# Patient Record
Sex: Male | Born: 1975 | Hispanic: Yes | Marital: Married | Smoking: Current every day smoker
Health system: Southern US, Community
[De-identification: ages and names within clinical notes are randomized; demographics above are authoritative.]

## PROBLEM LIST (undated history)

## (undated) DIAGNOSIS — I10 Essential (primary) hypertension: Secondary | ICD-10-CM

## (undated) DIAGNOSIS — T7840XA Allergy, unspecified, initial encounter: Secondary | ICD-10-CM

## (undated) HISTORY — DX: Allergy, unspecified, initial encounter: T78.40XA

---

## 2002-06-21 ENCOUNTER — Emergency Department (HOSPITAL_COMMUNITY): Admission: EM | Admit: 2002-06-21 | Discharge: 2002-06-21 | Payer: Self-pay | Admitting: Emergency Medicine

## 2007-02-28 ENCOUNTER — Emergency Department (HOSPITAL_COMMUNITY): Admission: EM | Admit: 2007-02-28 | Discharge: 2007-02-28 | Payer: Self-pay | Admitting: Emergency Medicine

## 2012-08-08 ENCOUNTER — Observation Stay (HOSPITAL_COMMUNITY): Payer: Private Health Insurance - Indemnity

## 2012-08-08 ENCOUNTER — Observation Stay (HOSPITAL_COMMUNITY)
Admission: EM | Admit: 2012-08-08 | Discharge: 2012-08-08 | Disposition: A | Discharge status: Home / Self Care | Payer: Private Health Insurance - Indemnity | Attending: Emergency Medicine | Admitting: Emergency Medicine

## 2012-08-08 ENCOUNTER — Encounter (HOSPITAL_COMMUNITY): Payer: Self-pay | Admitting: Emergency Medicine

## 2012-08-08 ENCOUNTER — Emergency Department (HOSPITAL_COMMUNITY): Payer: Private Health Insurance - Indemnity

## 2012-08-08 DIAGNOSIS — F17200 Nicotine dependence, unspecified, uncomplicated: Secondary | ICD-10-CM | POA: Insufficient documentation

## 2012-08-08 DIAGNOSIS — K760 Fatty (change of) liver, not elsewhere classified: Secondary | ICD-10-CM

## 2012-08-08 DIAGNOSIS — I10 Essential (primary) hypertension: Secondary | ICD-10-CM

## 2012-08-08 DIAGNOSIS — M79609 Pain in unspecified limb: Secondary | ICD-10-CM | POA: Insufficient documentation

## 2012-08-08 DIAGNOSIS — M25519 Pain in unspecified shoulder: Secondary | ICD-10-CM | POA: Insufficient documentation

## 2012-08-08 DIAGNOSIS — R079 Chest pain, unspecified: Principal | ICD-10-CM

## 2012-08-08 HISTORY — DX: Essential (primary) hypertension: I10

## 2012-08-08 LAB — CBC WITH DIFFERENTIAL/PLATELET
Basophils Absolute: 0.1 10*3/uL (ref 0.0–0.1)
Eosinophils Absolute: 0.3 10*3/uL (ref 0.0–0.7)
Eosinophils Relative: 5 % (ref 0–5)
Lymphocytes Relative: 44 % (ref 12–46)
MCH: 31.1 pg (ref 26.0–34.0)
MCV: 88.7 fL (ref 78.0–100.0)
Neutrophils Relative %: 42 % — ABNORMAL LOW (ref 43–77)
Platelets: 198 10*3/uL (ref 150–400)
RDW: 12.7 % (ref 11.5–15.5)
WBC: 6.6 10*3/uL (ref 4.0–10.5)

## 2012-08-08 LAB — TROPONIN I
Troponin I: <0.3 ng/mL (ref <0.30)
Troponin I: <0.3 ng/mL (ref <0.30)

## 2012-08-08 LAB — BASIC METABOLIC PANEL
Calcium: 9.5 mg/dL (ref 8.4–10.5)
GFR calc non Af Amer: 88 mL/min — ABNORMAL LOW (ref >90)
Sodium: 140 mEq/L (ref 135–145)

## 2012-08-08 MED ORDER — METOPROLOL TARTRATE 1 MG/ML IV SOLN
INTRAVENOUS | Status: AC
  Filled 2012-08-08: qty 5

## 2012-08-08 MED ORDER — HYDROCHLOROTHIAZIDE 25 MG PO TABS: 25.0000 mg | ORAL_TABLET | Freq: Every day | ORAL | Status: DC

## 2012-08-08 MED ORDER — ASPIRIN 81 MG PO CHEW
324.0000 mg | CHEWABLE_TABLET | Freq: Once | ORAL | Status: AC
  Administered 2012-08-08: 324 mg via ORAL
  Filled 2012-08-08: qty 4

## 2012-08-08 MED ORDER — METOPROLOL TARTRATE 25 MG PO TABS
100.0000 mg | ORAL_TABLET | Freq: Once | ORAL | Status: AC
  Administered 2012-08-08: 100 mg via ORAL
  Filled 2012-08-08: qty 4

## 2012-08-08 MED ORDER — IOHEXOL 350 MG/ML SOLN
80.0000 mL | Freq: Once | INTRAVENOUS | Status: AC | PRN
  Administered 2012-08-08: 80 mL via INTRAVENOUS

## 2012-08-08 NOTE — ED Notes (Signed)
Pt BMI is 30.9. Ht 5 ft 7 in, Wt 197.3 Lbs

## 2012-08-08 NOTE — ED Provider Notes (Signed)
Patient care passed on from Marcus Cole, [place]. 36 y/o male in CDU on CPP. Currently denies any chest pain, sob, shoulder or left arm pain, dizziness, or nausea. CTA results without any significant stenosis. Will discharge home with instructions for close f/u with cardiology. Also d/c with HCTZ as he discussed with Trixie Dredge, and will d/c lisinopril. Fatty liver seen on CT as well. Patient admits to a lot of alcohol use. Advised him to talk about this with his PCP. ETOH and smoking cessation highly encouraged. Case discussed with Dr. Donnetta Hutching who agrees with plan of care.  Trevor Mace, PA-C 08/08/12 1637

## 2012-08-08 NOTE — ED Notes (Addendum)
Called Dr. Carolan Shiver, pt HR 54, BP 128/85. Report pt ready to go to CT. Called CT, table unavailable at this time

## 2012-08-08 NOTE — ED Notes (Signed)
Patient transported to X-ray 

## 2012-08-08 NOTE — ED Notes (Signed)
Per Dr. Llana Aliment, give pt 100 mg PO Metoprolol once.

## 2012-08-08 NOTE — ED Provider Notes (Signed)
Medical screening examination/treatment/procedure(s) were performed by non-physician practitioner and as supervising physician I was immediately available for consultation/collaboration.   Benny Lennert, MD 08/08/12 1530

## 2012-08-08 NOTE — ED Notes (Signed)
Patient is back from x-ray and placed on monitor

## 2012-08-08 NOTE — ED Notes (Signed)
Pt states pain is gone and is just sleepy right now.

## 2012-08-08 NOTE — ED Notes (Signed)
Patient was at work when he started having left sided chest pain that radiated to his shoulders and left arm.  Denies any sob, dizziness, but became slightly nauseated.

## 2012-08-08 NOTE — ED Provider Notes (Signed)
12:46 PM Patient to move to CDU for chest pain protocol.  Sign out received from Marcus Pel, PA-C.  Patient with left sided chest pain that radiates into his left jaw and left arm.  Pt is a smoker, has untreated HTN.  Mother with CAD, recently had stent placed.  Pt to have coronary CT.  I have alerted CDU nurse Sarah who will call to find out if patient might have test done this afternoon.    1:35 PM Patient has arrived to CDU.  Reports pain in left chest now feels like "a bruise" and also "tingling."  Tingling also involves left face and shoulder.  No needs at this time.  Patient is A&Ox4, NAD, RRR, no m/r/g, chest ttp, lungs CTAB, abd soft, NT, extremities without edema, distal pulses intact. Patient verbalizes understanding of plan.  Will continue to follow.    3:19 PM Patient currently in CT.  Pt signed out to Marcus Gourd, PA-C, who assumes care of patient at change of shift.  Pt has been trying to take lisinopril for known hypertension but doesn't like the way it makes him feel.  Consider changing to HCTZ, with close follow up with PCP Dr Merla Riches at [facility] (if CT is negative).    [place], [place] 08/08/12 1520

## 2012-08-08 NOTE — ED Provider Notes (Signed)
History     CSN: [id number]  Arrival date & time 08/08/12  0904   First MD Initiated Contact with Patient 08/08/12 [phone]      Chief Complaint  Patient presents with  . Chest Pain    (Consider location/radiation/quality/duration/timing/severity/associated sxs/prior treatment) HPI  Patient presents to the emergency department with complaints of left-sided chest pain that radiates to the shoulder and the left arm. Pain started at 8 AM this morning while he was at work and has been waxing and waning every 10 minutes. He denies having any shortness of breath, dizziness, diaphoresis, nausea. He is currently pain free. He denies ever having any complaints of chest pain In the past a previously. He has risk factors of smoking, hypertension, family history. He states that his mom just had a stent placed but his dad does not have any heart disease. In the exam room his blood pressure is 132/76 and he remains pain-free. He states that pushing on it does not elicit the pain. He states that resting does not affect the pain. NAD/VSS   Past Medical History  Diagnosis Date  . Hypertension     History reviewed. No pertinent past surgical history.  History reviewed. No pertinent family history.  History  Substance Use Topics  . Smoking status: Current Everyday Smoker  . Smokeless tobacco: Not on file  . Alcohol Use: Yes      Review of Systems   HEENT: denies blurry vision or change in hearing PULMONARY: Denies difficulty breathing and SOB CARDIAC: denies chest pain or heart palpitations MUSCULOSKELETAL:  denies being unable to ambulate ABDOMEN AL: denies abdominal pain GU: denies loss of bowel or urinary control NEURO: denies numbness and tingling in extremities SKIN: no new rashes PSYCH: patient denies anxiety or depression. NECK: Pt denies having neck pain     Allergies  Review of patient's allergies indicates no known allergies.  Home Medications   Current Outpatient Rx    Name Route Sig Dispense Refill  . LISINOPRIL 20 MG PO TABS Oral Take 20 mg by mouth daily.    . ADULT MULTIVITAMIN W/MINERALS CH Oral Take 1 tablet by mouth daily.      BP 132/91  Pulse 73  Temp 98.1 F (36.7 C) (Oral)  Resp 17  SpO2 97%  Physical Exam  Nursing note and vitals reviewed. Constitutional: He appears well-developed and well-nourished. No distress.  HENT:  Head: Normocephalic and atraumatic.  Eyes: Pupils are equal, round, and reactive to light.  Neck: Normal range of motion. Neck supple.  Cardiovascular: Normal rate and regular rhythm.   Pulmonary/Chest: Effort normal. He exhibits no tenderness, no crepitus and no retraction.  Abdominal: Soft.  Neurological: He is alert.  Skin: Skin is warm and dry.    ED Course  Procedures (including critical care time)  Labs Reviewed  CBC WITH DIFFERENTIAL - Abnormal; Notable for the following:    Neutrophils Relative 42 (*)     All other components within normal limits  BASIC METABOLIC PANEL - Abnormal; Notable for the following:    Glucose, Bld 102 (*)     GFR calc non Af Amer 88 (*)     All other components within normal limits  TROPONIN I  TROPONIN I   Dg Chest 2 View  08/08/2012  *RADIOLOGY REPORT*  Clinical Data: Chest pain, history hypertension, smoking  CHEST - 2 VIEW  Comparison: None  Findings: Upper-normal size of cardiac silhouette. Mediastinal contours and pulmonary vascularity normal. Lungs clear. No pleural effusion  or pneumothorax. No acute osseous findings.  IMPRESSION: No acute abnormalities.   Original Report Authenticated By: Lollie Marrow, M.D.      No diagnosis found.    MDM   Date: 08/08/2012  Rate: 78  Rhythm: normal sinus rhythm  QRS Axis: normal  Intervals: normal  ST/T Wave abnormalities: normal  Conduction Disutrbances:none  Narrative Interpretation:   Old EKG Reviewed: none available   Patient work-up is benign thus far. Pt has 3 risks factors, hypertension, smoker and family  history. I have low suspicion that this pain is cardiac but after discussing with Dr. Estell Harpin, the patient is a good candidate for cardiac rule out.   Patient hand off to CDU PA for chest pain protocol and CT angio of the heart ordered.      Dorthula Matas, PA 08/08/12 1248

## 2012-08-08 NOTE — ED Provider Notes (Signed)
Medical screening examination/treatment/procedure(s) were performed by non-physician practitioner and as supervising physician I was immediately available for consultation/collaboration.   Jamaira Sherk L Manoah Deckard, MD 08/08/12 1530 

## 2012-08-08 NOTE — ED Notes (Signed)
Pt tolerated CT well, given 1 nitro SL at 1523.

## 2012-08-08 NOTE — ED Notes (Signed)
Patient also c/o tingling on his left face

## 2012-08-08 NOTE — ED Notes (Signed)
Pt c/o left sided CP with radiation into neck and left arm starting today

## 2012-08-11 NOTE — ED Provider Notes (Signed)
Medical screening examination/treatment/procedure(s) were performed by non-physician practitioner and as supervising physician I was immediately available for consultation/collaboration.  Donnetta Hutching, MD 08/11/12 2119

## 2012-08-17 NOTE — Progress Notes (Signed)
Observation review is complete for the 08/08/2012 visit. 

## 2012-10-25 ENCOUNTER — Encounter: Payer: Self-pay | Admitting: Internal Medicine

## 2012-10-25 ENCOUNTER — Ambulatory Visit (INDEPENDENT_AMBULATORY_CARE_PROVIDER_SITE_OTHER): Payer: Managed Care, Other (non HMO) | Admitting: Internal Medicine

## 2012-10-25 VITALS — BP 133/85 | HR 108 | Temp 98.3°F | Resp 18 | Ht 66.5 in | Wt 196.2 lb

## 2012-10-25 DIAGNOSIS — J40 Bronchitis, not specified as acute or chronic: Secondary | ICD-10-CM

## 2012-10-25 MED ORDER — AZITHROMYCIN 500 MG PO TABS: 500.0000 mg | ORAL_TABLET | Freq: Every day | ORAL | Status: DC

## 2012-10-25 MED ORDER — HYDROCODONE-ACETAMINOPHEN 7.5-325 MG/15ML PO SOLN: 15.0000 mL | Freq: Four times a day (QID) | ORAL | Status: DC | PRN

## 2012-10-25 NOTE — Patient Instructions (Addendum)
Bronchitis  Bronchitis is a problem of the air tubes leading to your lungs. This problem makes it hard for air to get in and out of the lungs. You may cough a lot because your air tubes are narrow. Going without care can cause lasting (chronic) bronchitis.  HOME CARE    Drink enough fluids to keep your pee (urine) clear or pale yellow.   Use a cool mist humidifier.   Quit smoking if you smoke. If you keep smoking, the bronchitis might not get better.   Only take medicine as told by your doctor.  GET HELP RIGHT AWAY IF:    Coughing keeps you awake.   You start to wheeze.   You become more sick or weak.   You have a hard time breathing or get short of breath.   You cough up blood.   Coughing lasts more than 2 weeks.   You have a fever.   Your baby is older than 3 months with a rectal temperature of 102 F (38.9 C) or higher.   Your baby is 3 months old or younger with a rectal temperature of 100.4 F (38 C) or higher.  MAKE SURE YOU:   Understand these instructions.   Will watch your condition.   Will get help right away if you are not doing well or get worse.  Document Released: 05/18/2008 Document Revised: 02/22/2012 Document Reviewed: 11/01/2009  ExitCare Patient Information 2013 [employer].

## 2012-10-25 NOTE — Progress Notes (Signed)
  Subjective:    Patient ID: Marcus Cole, male    DOB: 1976/06/03, 36 y.o.   MRN: [id number]  HPI Cough and yellow sputum for 5-6days Smoker, no sob or cp   Review of Systems     Objective:   Physical Exam Coarse bs, no rales       Assessment & Plan:  Bronchitis

## 2013-06-01 ENCOUNTER — Other Ambulatory Visit: Payer: Self-pay | Admitting: Family Medicine

## 2013-09-07 ENCOUNTER — Ambulatory Visit (INDEPENDENT_AMBULATORY_CARE_PROVIDER_SITE_OTHER): Payer: 59 | Admitting: Family Medicine

## 2013-09-07 ENCOUNTER — Ambulatory Visit: Payer: 59

## 2013-09-07 VITALS — BP 120/88 | HR 82 | Temp 98.0°F | Resp 18 | Ht 66.5 in | Wt 186.0 lb

## 2013-09-07 DIAGNOSIS — R079 Chest pain, unspecified: Secondary | ICD-10-CM

## 2013-09-07 DIAGNOSIS — R0781 Pleurodynia: Secondary | ICD-10-CM

## 2013-09-07 MED ORDER — MELOXICAM 15 MG PO TABS: 15.0000 mg | ORAL_TABLET | Freq: Every day | ORAL | Status: DC

## 2013-09-07 MED ORDER — HYDROCODONE-ACETAMINOPHEN 5-325 MG PO TABS: 1.0000 | ORAL_TABLET | Freq: Three times a day (TID) | ORAL | Status: DC | PRN
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
## 2014-03-26 IMAGING — CR DG RIBS W/ CHEST 3+V*L*
4 series · 4 of 4 positions shown · non-contrast
Comparison: None.

CLINICAL DATA: Left rib and chest pain.

EXAM:
LEFT RIBS AND CHEST - 3+ VIEW

[lateral]
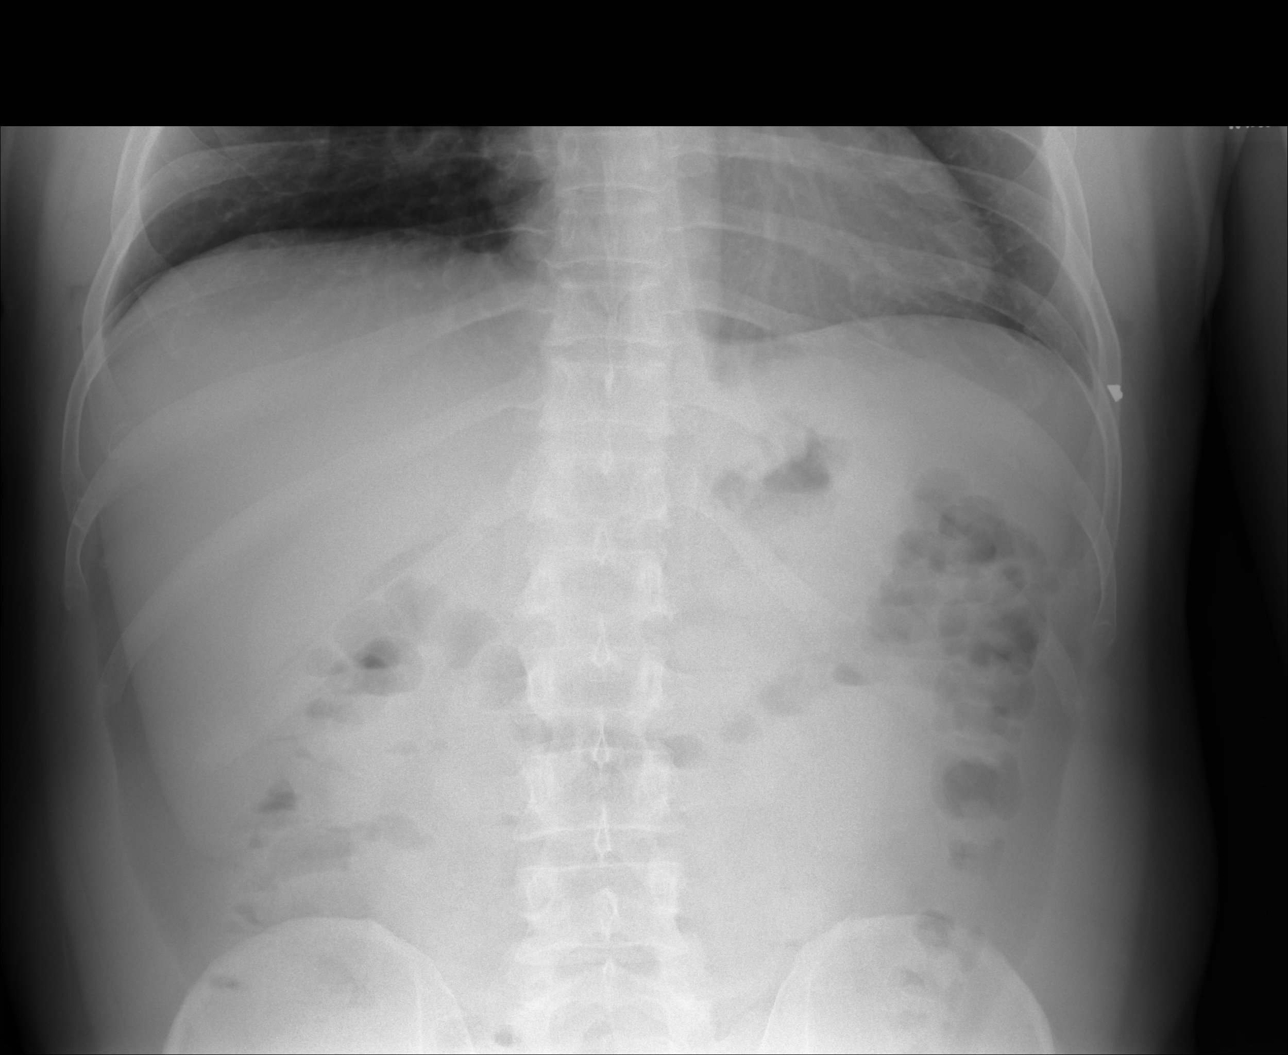

[PA]
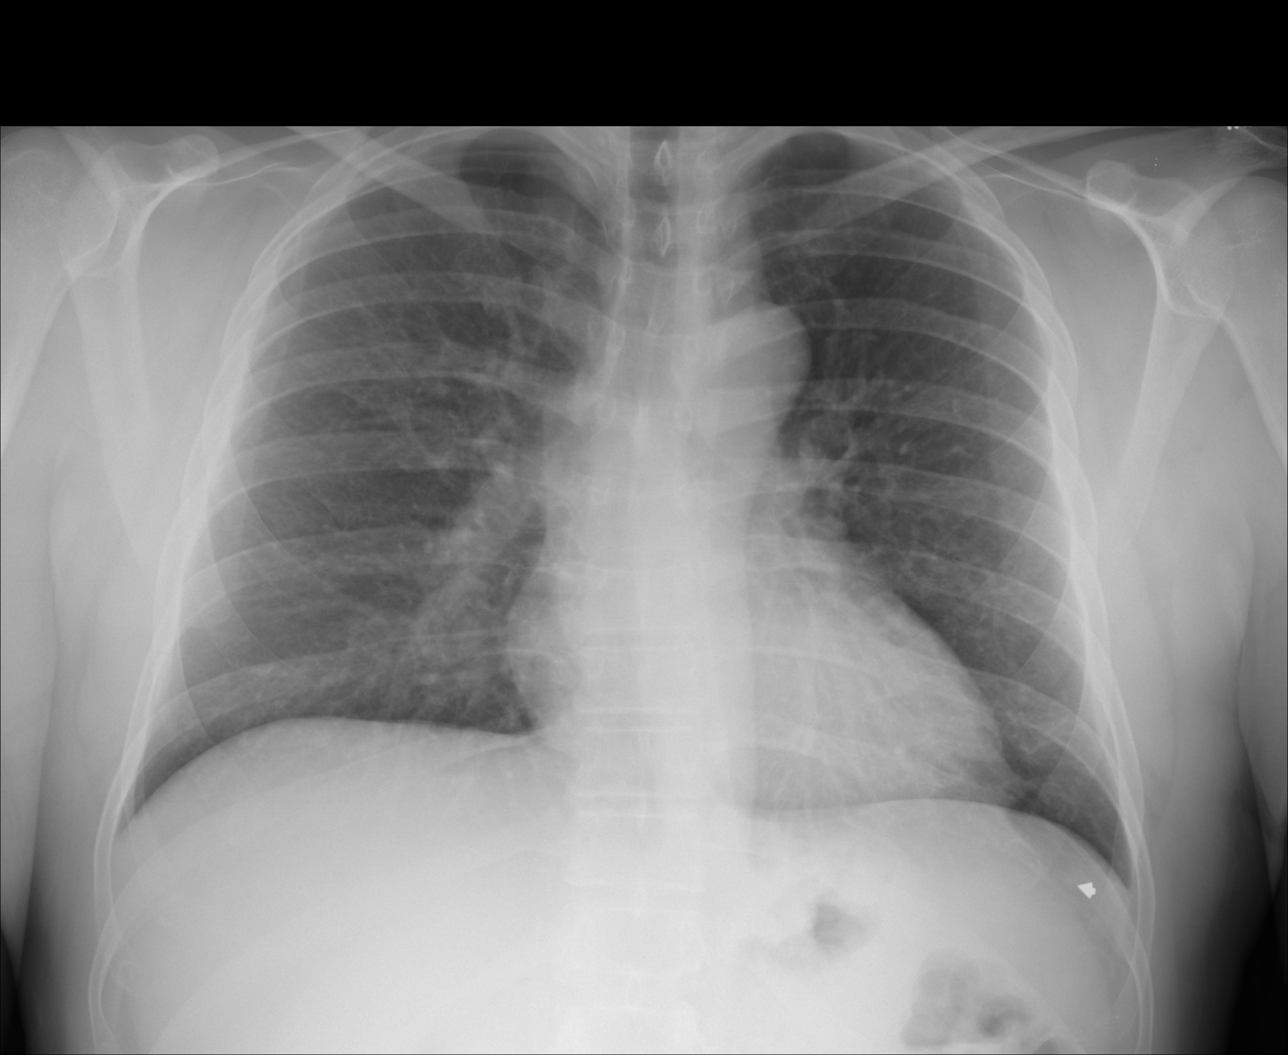

[oblique (1 of 2)]
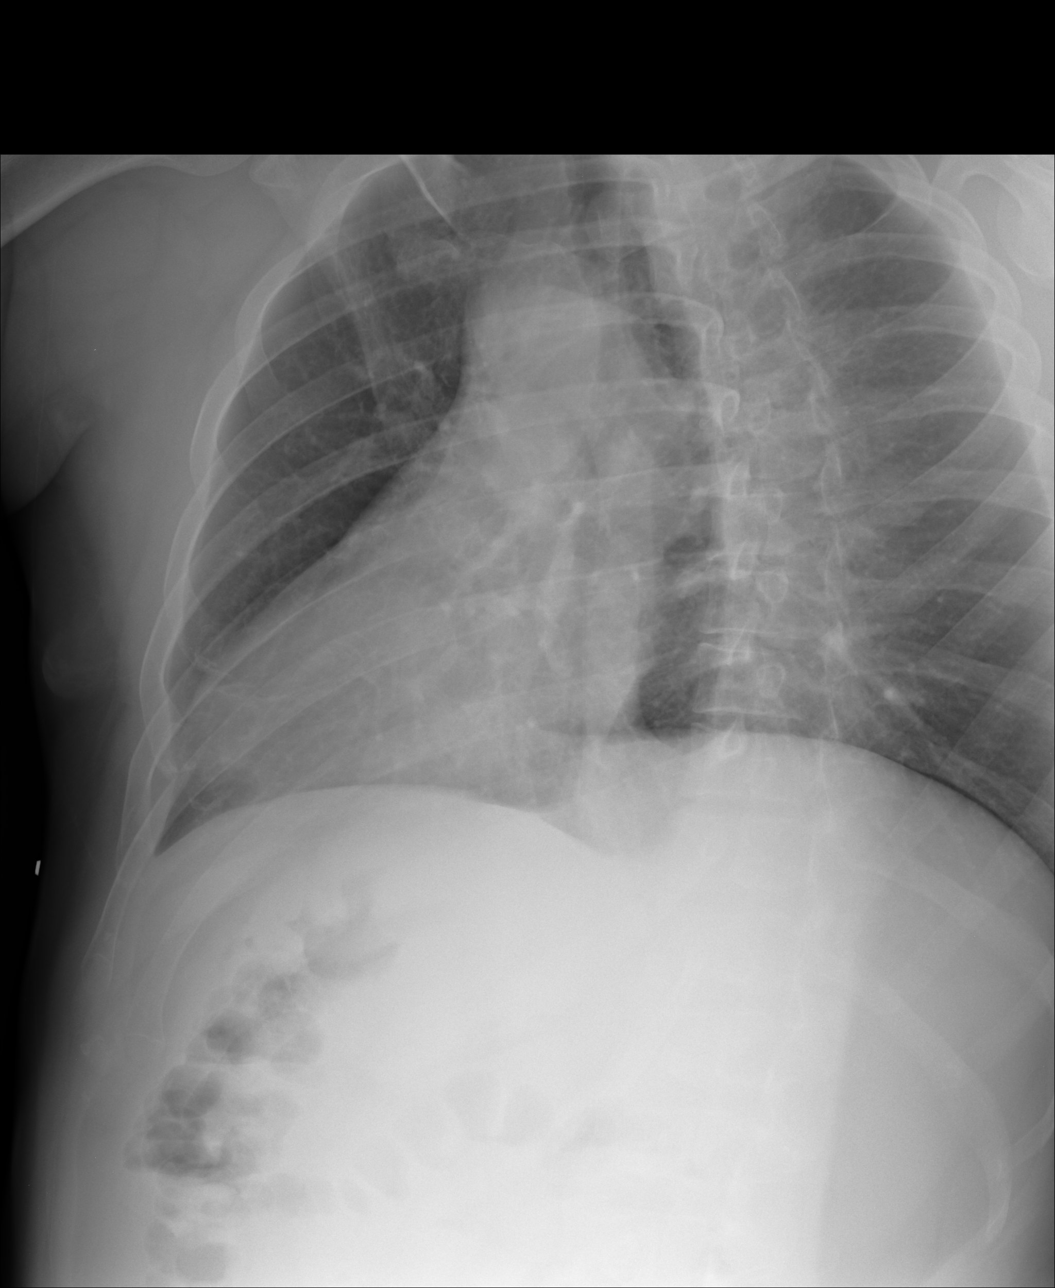

[oblique (2 of 2)]
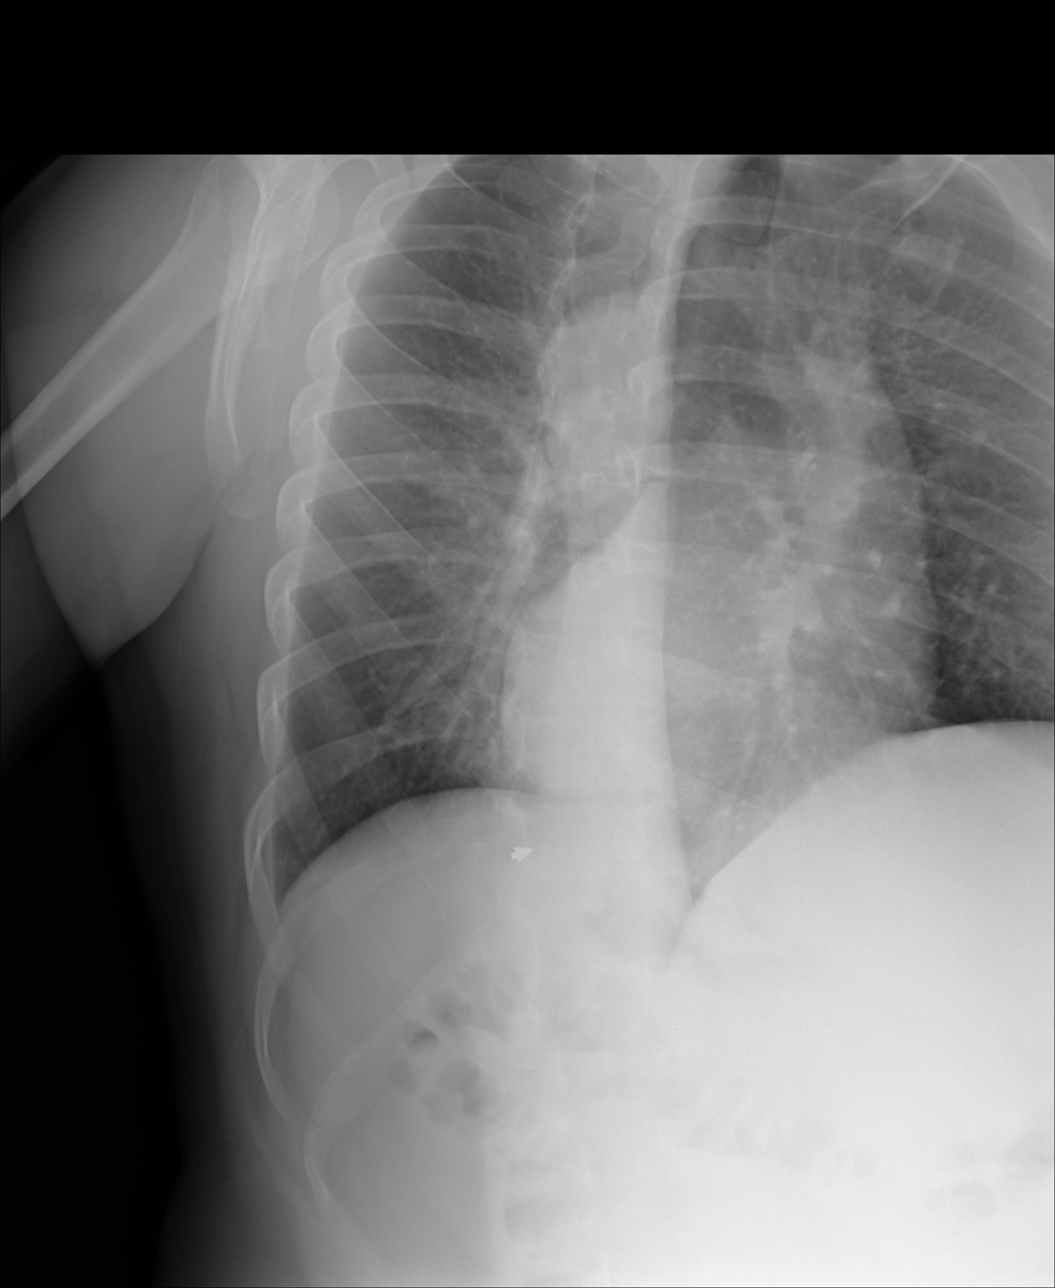

[4 of 4 positions shown; findings below may reference images not displayed]

FINDINGS: No evidence of left-sided rib fracture or pneumothorax.

Pulmonary vascular prominence most notable centrally similar to
prior exam.

Tortuous aorta.

Heart size top-normal.

No segmental infiltrate.
IMPRESSION: No evidence of left-sided rib fracture or pneumothorax.

Pulmonary vascular prominence most notable centrally similar to
prior exam.

Tortuous aorta.

## 2016-01-10 ENCOUNTER — Ambulatory Visit (INDEPENDENT_AMBULATORY_CARE_PROVIDER_SITE_OTHER): Payer: Self-pay | Admitting: Physician Assistant

## 2016-01-10 VITALS — BP 92/64 | HR 90 | Temp 98.5°F | Resp 12 | Ht 67.25 in | Wt 210.0 lb

## 2016-01-10 DIAGNOSIS — Z8679 Personal history of other diseases of the circulatory system: Secondary | ICD-10-CM

## 2016-01-10 DIAGNOSIS — R0989 Other specified symptoms and signs involving the circulatory and respiratory systems: Secondary | ICD-10-CM

## 2016-01-10 DIAGNOSIS — Z716 Tobacco abuse counseling: Secondary | ICD-10-CM

## 2016-01-10 DIAGNOSIS — Z720 Tobacco use: Secondary | ICD-10-CM

## 2016-01-10 LAB — COMPLETE METABOLIC PANEL WITH GFR
ALBUMIN: 4.5 g/dL (ref 3.6–5.1)
ALT: 39 U/L (ref 9–46)
AST: 31 U/L (ref 10–40)
Alkaline Phosphatase: 61 U/L (ref 40–115)
BUN: 14 mg/dL (ref 7–25)
CALCIUM: 9.2 mg/dL (ref 8.6–10.3)
CHLORIDE: 104 mmol/L (ref 98–110)
CO2: 26 mmol/L (ref 20–31)
Creat: 1.07 mg/dL (ref 0.60–1.35)
GFR, EST NON AFRICAN AMERICAN: 86 mL/min (ref >=60)
GFR, Est African American: >89 mL/min (ref >=60)
Glucose, Bld: 106 mg/dL — ABNORMAL HIGH (ref 65–99)
POTASSIUM: 5 mmol/L (ref 3.5–5.3)
Sodium: 138 mmol/L (ref 135–146)
Total Bilirubin: 0.5 mg/dL (ref 0.2–1.2)
Total Protein: 7.1 g/dL (ref 6.1–8.1)

## 2016-01-10 LAB — POCT CBC
GRANULOCYTE PERCENT: 55.7 % (ref 37–80)
HEMATOCRIT: 48.4 % (ref 43.5–53.7)
Hemoglobin: 16.6 g/dL (ref 14.1–18.1)
Lymph, poc: 2.8 (ref 0.6–3.4)
MCH: 30.3 pg (ref 27–31.2)
MCHC: 34.3 g/dL (ref 31.8–35.4)
MCV: 88.3 fL (ref 80–97)
MID (cbc): 0.7 (ref 0–0.9)
MPV: 6.8 fL (ref 0–99.8)
POC Granulocyte: 4.3 (ref 2–6.9)
POC LYMPH PERCENT: 35.5 %L (ref 10–50)
POC MID %: 8.8 % (ref 0–12)
Platelet Count, POC: 221 10*3/uL (ref 142–424)
RBC: 5.48 M/uL (ref 4.69–6.13)
RDW, POC: 13.3 %
WBC: 7.8 10*3/uL (ref 4.6–10.2)

## 2016-01-10 LAB — POCT URINALYSIS DIP (MANUAL ENTRY)
BILIRUBIN UA: NEGATIVE
BILIRUBIN UA: NEGATIVE
Blood, UA: NEGATIVE
Glucose, UA: NEGATIVE
LEUKOCYTES UA: NEGATIVE
NITRITE UA: NEGATIVE
Protein Ur, POC: 30 — AB
Spec Grav, UA: 1.025
Urobilinogen, UA: 0.2
pH, UA: 5.5

## 2016-01-10 MED ORDER — VARENICLINE TARTRATE 1 MG PO TABS: 1.0000 mg | ORAL_TABLET | Freq: Two times a day (BID) | ORAL | Status: DC

## 2016-01-10 MED ORDER — LISINOPRIL 20 MG PO TABS: 20.0000 mg | ORAL_TABLET | Freq: Every day | ORAL | Status: DC

## 2016-01-10 MED ORDER — VARENICLINE TARTRATE 0.5 MG X 11 & 1 MG X 42 PO MISC: ORAL | Status: DC

## 2016-01-10 NOTE — Progress Notes (Signed)
01/10/2016 12:08 PM   DOB: [birth year] / MRN: [id number]  SUBJECTIVE:  Marcus Cole is a 40 y.o. male presenting for BP recheck and smoking cessation.  He has a history of HTN and reports that his job was making this worse along with multiple energy drinks.  He has since been let go and reports less stress and is not drinking coffee as much.  Denies chest pain, DOE, HA. Is going to start driving CMVs.    He would like to quit smoking.  Has tried cold [place] and nicotine replacement.  Has been smoking 1/2 packs per day since he was 40 years of age.   He is allergic to latex.   He  has a past medical history of Hypertension and Allergy.    He  reports that he has been smoking.  He does not have any smokeless tobacco history on file. He reports that he drinks about 7.2 oz of alcohol per week. He reports that he does not use illicit drugs. He  reports that he currently engages in sexual activity. The patient  has no past surgical history on file.  His family history includes Heart disease in his mother; Hypertension in his father.  Review of Systems  Constitutional: Negative for fever and chills.  Eyes: Negative for blurred vision.  Respiratory: Negative for cough and shortness of breath.   Cardiovascular: Negative for chest pain.  Gastrointestinal: Negative for nausea and abdominal pain.  Genitourinary: Negative for dysuria, urgency and frequency.  Musculoskeletal: Negative for myalgias.  Skin: Negative for rash.  Neurological: Negative for dizziness, tingling and headaches.  Psychiatric/Behavioral: Negative for depression. The patient is not nervous/anxious.     Problem list and medications reviewed and updated by myself where necessary, and exist elsewhere in the encounter.   OBJECTIVE:  BP 92/64 mmHg  Pulse 90  Temp(Src) 98.5 F (36.9 C) (Oral)  Resp 12  Ht 5' 7.25" (1.708 m)  Wt 210 lb (95.255 kg)  BMI 32.65 kg/m2  SpO2 97%  Physical Exam  Constitutional: He is  oriented to person, place, and time. He appears well-developed. He does not appear ill.  Eyes: Conjunctivae and EOM are normal. Pupils are equal, round, and reactive to light.  Cardiovascular: Normal rate.   Pulmonary/Chest: Effort normal.  Abdominal: He exhibits no distension.  Musculoskeletal: Normal range of motion.  Neurological: He is alert and oriented to person, place, and time. No cranial nerve deficit. Coordination normal.  Skin: Skin is warm and dry. He is not diaphoretic.  Psychiatric: He has a normal mood and affect.  Nursing note and vitals reviewed.   Results for orders placed or performed in visit on 01/10/16 (from the past 72 hour(s))  POCT urinalysis dipstick     Status: Abnormal   Collection Time: 01/10/16 11:44 AM  Result Value Ref Range   Color, UA yellow yellow   Clarity, UA clear clear   Glucose, UA negative negative   Bilirubin, UA negative negative   Ketones, POC UA negative negative   Spec Grav, UA 1.025    Blood, UA negative negative   pH, UA 5.5    Protein Ur, POC =30 (A) negative   Urobilinogen, UA 0.2    Nitrite, UA Negative Negative   Leukocytes, UA Negative Negative  POCT CBC     Status: None   Collection Time: 01/10/16 11:44 AM  Result Value Ref Range   WBC 7.8 4.6 - 10.2 K/uL   Lymph, poc 2.8 0.6 - 3.4  POC LYMPH PERCENT 35.5 10 - 50 %L   MID (cbc) 0.7 0 - 0.9   POC MID % 8.8 0 - 12 %M   POC Granulocyte 4.3 2 - 6.9   Granulocyte percent 55.7 37 - 80 %G   RBC 5.48 4.69 - 6.13 M/uL   Hemoglobin 16.6 14.1 - 18.1 g/dL   HCT, POC 16.1 09.6 - 53.7 %   MCV 88.3 80 - 97 fL   MCH, POC 30.3 27 - 31.2 pg   MCHC 34.3 31.8 - 35.4 g/dL   RDW, POC 04.5 %   Platelet Count, POC 221 142 - 424 K/uL   MPV 6.8 0 - 99.8 fL    No results found.  ASSESSMENT AND PLAN  Marcus Cole was seen today for blood pressure check and medication refill.  Diagnoses and all orders for this visit:  History of hypertension: BP rechecked by me at 134/88. He is  asymptomatic and per his urine appears somewhat dehydrated. Advised he push po fluids.   Will refill his Lisonopril for 6 months. Advised that he monitor his pressure at home and contact me if his pressures are consistently greater than 140/90.    Pressure should also improve with smoking cessation.  -     POCT urinalysis dipstick -     COMPLETE METABOLIC PANEL WITH GFR -     POCT CBC -     lisinopril (PRINIVIL,ZESTRIL) 20 MG tablet; Take 1 tablet (20 mg total) by mouth daily. Return to clinic in July for recheck.  Encounter for smoking cessation counseling -     varenicline (CHANTIX CONTINUING MONTH PAK) 1 MG tablet; Take 1 tablet (1 mg total) by mouth 2 (two) times daily. -     varenicline (CHANTIX STARTING MONTH PAK) 0.5 MG X 11 & 1 MG X 42 tablet; Take one 0.5 mg tablet by mouth once daily for 3 days, then increase to one 0.5 mg tablet twice daily for 4 days, then increase to one 1 mg tablet twice daily.    The patient was advised to call or return to clinic if he does not see an improvement in symptoms or to seek the care of the closest emergency department if he worsens with the above plan.   Deliah Boston, MHS, PA-C [facility] 01/10/2016 12:08 PM

## 2016-11-13 ENCOUNTER — Other Ambulatory Visit: Payer: Self-pay | Admitting: Physician Assistant

## 2016-11-13 DIAGNOSIS — Z8679 Personal history of other diseases of the circulatory system: Secondary | ICD-10-CM

## 2016-11-15 NOTE — Telephone Encounter (Signed)
12/2015 last ov and labs needs ov

## 2017-01-14 ENCOUNTER — Other Ambulatory Visit: Payer: Self-pay | Admitting: Physician Assistant

## 2017-01-14 DIAGNOSIS — Z8679 Personal history of other diseases of the circulatory system: Secondary | ICD-10-CM

## 2017-01-14 NOTE — Telephone Encounter (Signed)
Spoke with patient. He doesn't need a refill at this time. Advised he needs OV for refills.  Transferred to Ms. Legrand Rams. Hinson for pricing questions.

## 2018-08-30 ENCOUNTER — Encounter: Payer: Self-pay | Admitting: Family Medicine

## 2018-08-30 ENCOUNTER — Ambulatory Visit (INDEPENDENT_AMBULATORY_CARE_PROVIDER_SITE_OTHER): Payer: 59 | Admitting: Family Medicine

## 2018-08-30 VITALS — BP 140/90 | HR 86 | Temp 98.4°F | Resp 12 | Ht 67.25 in | Wt 226.2 lb

## 2018-08-30 DIAGNOSIS — Z8679 Personal history of other diseases of the circulatory system: Secondary | ICD-10-CM | POA: Insufficient documentation

## 2018-08-30 DIAGNOSIS — F17200 Nicotine dependence, unspecified, uncomplicated: Secondary | ICD-10-CM

## 2018-08-30 DIAGNOSIS — I10 Essential (primary) hypertension: Secondary | ICD-10-CM

## 2018-08-30 DIAGNOSIS — Z6835 Body mass index (BMI) 35.0-35.9, adult: Secondary | ICD-10-CM | POA: Diagnosis not present

## 2018-08-30 MED ORDER — VARENICLINE TARTRATE 1 MG PO TABS: 1.0000 mg | ORAL_TABLET | Freq: Two times a day (BID) | ORAL | 1 refills | Status: AC

## 2018-08-30 MED ORDER — LISINOPRIL 20 MG PO TABS: 20.0000 mg | ORAL_TABLET | Freq: Every day | ORAL | 1 refills | Status: DC

## 2018-08-30 MED ORDER — VARENICLINE TARTRATE 0.5 MG X 11 & 1 MG X 42 PO MISC: ORAL | 0 refills | Status: AC

## 2018-08-30 NOTE — Progress Notes (Signed)
HPI:   Marcus Cole is a 42 y.o. male, who is here today to establish care.  Former PCP: N/A Last preventive routine visit: 01/2017  Chronic medical problems: HTN,tobacco use disorder,obesity.   Concerns today: Medication refill.  History of hypertension for about 3 to 4 years. He is on lisinopril 20 mg daily, ran out about 5 days ago. 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  He does not check BP at home.  Not consistent with low salt diet.  Last eye exam 05/2018.  He does not exercise regularly. Because of his job he does not follow a healthy diet,fast food mostly.  Smoker, Chantix was prescribed before but he did not pick it up. He has not tried to quit smoking before and he is not sure if he is ready to do so. He has not noted cough,wheezing ,or dyspnea.   Review of Systems  Constitutional: Negative for activity change, appetite change, fatigue, fever and unexpected weight change.  HENT: Negative for mouth sores, nosebleeds, sore throat and trouble swallowing.   Eyes: Negative for redness and visual disturbance.  Respiratory: Negative for apnea, cough, shortness of breath and wheezing.   Cardiovascular: Negative for chest pain, palpitations and leg swelling.  Gastrointestinal: Negative for abdominal pain, nausea and vomiting.  Genitourinary: Negative for decreased urine volume and hematuria.  Neurological: Negative for syncope, weakness and headaches.      Current Outpatient Medications on File Prior to Visit  Medication Sig Dispense Refill  . ketorolac (TORADOL) 10 MG tablet Take by mouth.    . methocarbamol (ROBAXIN) 750 MG tablet TK 1 T PO QID FOR 10 DAYS  0  . Multiple Vitamin (MULTIVITAMIN WITH MINERALS) TABS Take 1 tablet by mouth daily. Reported on 01/10/2016     No current  facility-administered medications on file prior to visit.      Past Medical History:  Diagnosis Date  . Allergy   . Hypertension    Allergies  Allergen Reactions  . Latex Rash    Family History  Problem Relation Age of Onset  . Heart disease Mother   . Hypertension Father     Social History   Socioeconomic History  . Marital status: Married    Spouse name: Not on file  . Number of children: Not on file  . Years of education: Not on file  . Highest education level: Not on file  Occupational History  . Not on file  Social Needs  . Financial resource strain: Not on file  . Food insecurity:    Worry: Not on file    Inability: Not on file  . Transportation needs:    Medical: Not on file    Non-medical: Not on file  Tobacco Use  . Smoking status: Current Every Day Smoker  . Smokeless tobacco: Never Used  Substance and Sexual Activity  . Alcohol use: Yes    Alcohol/week: 12.0 standard drinks    Types: 12 Cans of beer per week  . Drug use: No  . Sexual activity: Yes  Lifestyle  . Physical activity:    Days per week: Not on file    Minutes per session: Not on file  . Stress: Not on file  Relationships  . Social connections:    Talks on phone: Not on file  Gets together: Not on file    Attends religious service: Not on file    Active member of club or organization: Not on file    Attends meetings of clubs or organizations: Not on file    Relationship status: Not on file  Other Topics Concern  . Not on file  Social History Narrative  . Not on file    Vitals:   08/30/18 1210  BP: 140/90  Pulse: 86  Resp: 12  Temp: 98.4 F (36.9 C)  SpO2: 98%    Body mass index is 35.17 kg/m.    Physical Exam  Nursing note and vitals reviewed. Constitutional: He is oriented to person, place, and time. He appears well-developed. No distress.  HENT:  Head: Normocephalic and atraumatic.  Mouth/Throat: Oropharynx is clear and moist and mucous membranes are  normal.  Eyes: Pupils are equal, round, and reactive to light. Conjunctivae are normal.  Cardiovascular: Normal rate and regular rhythm.  No murmur heard. Pulses:      Dorsalis pedis pulses are 2+ on the right side, and 2+ on the left side.  Respiratory: Effort normal and breath sounds normal. No respiratory distress.  GI: Soft. He exhibits no mass. There is no hepatomegaly. There is no tenderness.  Musculoskeletal: He exhibits edema (Trace pitting LE edema, bilateral.).  Lymphadenopathy:    He has no cervical adenopathy.  Neurological: He is alert and oriented to person, place, and time. He has normal strength. No cranial nerve deficit. Gait normal.  Skin: Skin is warm. No rash noted. No erythema.  Psychiatric: He has a normal mood and affect.  Well groomed, good eye contact.     ASSESSMENT AND PLAN:   Mr. Jony was seen today for establish care.  Diagnoses and all orders for this visit:  Severe obesity (BMI 35.0-35.9 with comorbidity) (HCC)  We discussed benefits of wt loss as well as adverse effects of obesity. Consistency with healthy diet and physical activity recommended. Daily brisk walking for 15-30 min as tolerated.  Benign essential hypertension  Not well controlled. Possible complications of elevated BP discussed. He will resume Lisinopril same dose. Recommend monitoring BP. Eye exam is current.He has an appt with occupational nurse to re-check BP in a week.  F/U in 3 months.  -     lisinopril (PRINIVIL,ZESTRIL) 20 MG tablet; Take 1 tablet (20 mg total) by mouth daily.  Tobacco use disorder  Adverse effects of tobacco and benefits of smoking cessation discussed. He would like to try Chantix,sode effects discussed.  -     varenicline (CHANTIX STARTING MONTH PAK) 0.5 MG X 11 & 1 MG X 42 tablet; Take one 0.5 mg tablet by mouth once daily for 3 days, then increase to one 0.5 mg tablet twice daily for 4 days, then increase to one 1 mg tablet twice daily. -      varenicline (CHANTIX CONTINUING MONTH PAK) 1 MG tablet; Take 1 tablet (1 mg total) by mouth 2 (two) times daily.     Betty G. [place], MD  [facility]. [place] office.

## 2018-08-30 NOTE — Patient Instructions (Signed)
A few things to remember from today's visit:   History of hypertension - Plan: lisinopril (PRINIVIL,ZESTRIL) 20 MG tablet  Encounter for smoking cessation counseling  Tobacco use disorder - Plan: varenicline (CHANTIX STARTING MONTH PAK) 0.5 MG X 11 & 1 MG X 42 tablet, varenicline (CHANTIX CONTINUING MONTH PAK) 1 MG tablet  How to Take Your Blood Pressure You can take your blood pressure at home with a machine. You may need to check your blood pressure at home:  To check if you have high blood pressure (hypertension).  To check your blood pressure over time.  To make sure your blood pressure medicine is working.  Supplies needed: You will need a blood pressure machine, or monitor. You can buy one at a drugstore or online. When choosing one:  Choose one with an arm cuff.  Choose one that wraps around your upper arm. Only one finger should fit between your arm and the cuff.  Do not choose one that measures your blood pressure from your wrist or finger.  Your doctor can suggest a monitor. How to prepare Avoid these things for 30 minutes before checking your blood pressure:  Drinking caffeine.  Drinking alcohol.  Eating.  Smoking.  Exercising.  Five minutes before checking your blood pressure:  Pee.  Sit in a dining chair. Avoid sitting in a soft couch or armchair.  Be quiet. Do not talk.  How to take your blood pressure Follow the instructions that came with your machine. If you have a digital blood pressure monitor, these may be the instructions: 1. Sit up straight. 2. Place your feet on the floor. Do not cross your ankles or legs. 3. Rest your left arm at the level of your heart. You may rest it on a table, desk, or chair. 4. Pull up your shirt sleeve. 5. Wrap the blood pressure cuff around the upper part of your left arm. The cuff should be 1 inch (2.5 cm) above your elbow. It is best to wrap the cuff around bare skin. 6. Fit the cuff snugly around your arm. You  should be able to place only one finger between the cuff and your arm. 7. Put the cord inside the groove of your elbow. 8. Press the power button. 9. Sit quietly while the cuff fills with air and loses air. 10. Write down the numbers on the screen. 11. Wait 2-3 minutes and then repeat steps 1-10.  What do the numbers mean? Two numbers make up your blood pressure. The first number is called systolic pressure. The second is called diastolic pressure. An example of a blood pressure reading is "120 over 80" (or 120/80). If you are an adult and do not have a medical condition, use this guide to find out if your blood pressure is normal: Normal  First number: below 120.  Second number: below 80. Elevated  First number: 120-129.  Second number: below 80. Hypertension stage 1  First number: 130-139.  Second number: 80-89. Hypertension stage 2  First number: 140 or above.  Second number: 90 or above. Your blood pressure is above normal even if only the top or bottom number is above normal. Follow these instructions at home:  Check your blood pressure as often as your doctor tells you to.  Take your monitor to your next doctor's appointment. Your doctor will: ? Make sure you are using it correctly. ? Make sure it is working right.  Make sure you understand what your blood pressure numbers should be.  Tell your doctor if your medicines are causing side effects. Contact a doctor if:  Your blood pressure keeps being high. Get help right away if:  Your first blood pressure number is higher than 180.  Your second blood pressure number is higher than 120. This information is not intended to replace advice given to you by your health care provider. Make sure you discuss any questions you have with your health care provider. Document Released: 11/12/2008 Document Revised: 10/28/2016 Document Reviewed: 05/08/2016 Elsevier Interactive Patient Education  2018 [employer].   Please  be sure medication list is accurate. If a new problem present, please set up appointment sooner than planned today.

## 2019-05-01 ENCOUNTER — Other Ambulatory Visit: Payer: Self-pay | Admitting: Family Medicine

## 2019-05-01 DIAGNOSIS — I10 Essential (primary) hypertension: Secondary | ICD-10-CM

## 2021-01-10 DIAGNOSIS — I10 Essential (primary) hypertension: Secondary | ICD-10-CM | POA: Insufficient documentation

## 2021-01-10 DIAGNOSIS — M545 Low back pain, unspecified: Secondary | ICD-10-CM | POA: Insufficient documentation

## 2022-12-01 ENCOUNTER — Ambulatory Visit: Payer: 59 | Admitting: Adult Health

## 2022-12-08 ENCOUNTER — Ambulatory Visit: Payer: Self-pay | Admitting: Internal Medicine

## 2022-12-11 ENCOUNTER — Ambulatory Visit: Payer: 59 | Admitting: Adult Health

## 2022-12-15 ENCOUNTER — Ambulatory Visit (INDEPENDENT_AMBULATORY_CARE_PROVIDER_SITE_OTHER): Payer: Self-pay | Admitting: Internal Medicine

## 2022-12-15 ENCOUNTER — Encounter: Payer: Self-pay | Admitting: Internal Medicine

## 2022-12-15 VITALS — BP 145/84 | HR 76 | Temp 98.0°F | Ht 67.25 in | Wt 252.0 lb

## 2022-12-15 DIAGNOSIS — G4730 Sleep apnea, unspecified: Secondary | ICD-10-CM | POA: Insufficient documentation

## 2022-12-15 DIAGNOSIS — Z5989 Other problems related to housing and economic circumstances: Secondary | ICD-10-CM

## 2022-12-15 DIAGNOSIS — I10 Essential (primary) hypertension: Secondary | ICD-10-CM

## 2022-12-15 HISTORY — DX: Sleep apnea, unspecified: G47.30

## 2022-12-15 MED ORDER — LISINOPRIL 20 MG PO TABS: 20.0000 mg | ORAL_TABLET | Freq: Every day | ORAL | 1 refills | Status: DC

## 2022-12-15 NOTE — Assessment & Plan Note (Addendum)
Uncontrolled Resume lisinopril @ 20 mg daily  Encouraged home checks

## 2022-12-15 NOTE — Progress Notes (Signed)
[facility]  Phone: [phone]  New patient visit  Visit Date: 12/15/2022 Patient: Marcus Cole   DOB: 02/21/1976   47 y.o. Male  MRN: [id number]  Today's healthcare provider: Loralee Pacas, MD  Assessment and Plan:     Christopherjose was seen today for establish care and hypertension.  Benign essential hypertension Overview: Long history of hypertension Took lisinopril in past He couldn't tolerate hydrochloroTHIAZIDE before     Assessment & Plan: Uncontrolled Resume lisinopril @ 20 mg daily  Encouraged home checks    Orders: -     Lisinopril; Take 1 tablet (20 mg total) by mouth daily.  Dispense: 90 tablet; Refill: 1  Primary hypertension Overview: Long history of hypertension Took lisinopril in past He couldn't tolerate hydrochloroTHIAZIDE before     Assessment & Plan: Uncontrolled Resume lisinopril @ 20 mg daily  Encouraged home checks    Orders: -     Lisinopril; Take 1 tablet (20 mg total) by mouth daily.  Dispense: 90 tablet; Refill: 1  Sleep apnea, unspecified type Overview: STOP-Bang Score Do you snore loudly?: Yes Do you often feel tired, fatigued, or sleepy during the daytime?: Yes Has anyone observed you stop breathing during sleep?: Yes Do you have (or are you being treated for) high blood pressure?: Yes Recent BMI (Calculated):  BMI Readings from Last 1 Encounters:  12/15/22 39.18 kg/m   Is BMI greater than 35 kg/m2?:  Yes Age older than 48 years old?: No Has large neck size > 40 cm (15.7 in, large male shirt size, large male collar size > 16): Yes Gender Male?:  Yes STOP-Bang Total Score(total yes answers):  7  Orders: -     Ambulatory referral to Neurology  Does not have health insurance Overview: Significant barrier for Social determinants of health: no insurance so we limited to just blood pressure blood pressure management today to 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    Subjective:  Patient presents today to establish care.  Chief Complaint  Patient presents with   Establish Care   Hypertension    Problem-oriented charting was used to develop and update his medical history: Problem  Sleep Apnea   STOP-Bang Score Do you snore loudly?: Yes Do you often feel tired, fatigued, or sleepy during the daytime?: Yes Has anyone observed you stop breathing during sleep?: Yes Do you have (or are you being treated for) high blood pressure?: Yes Recent BMI (Calculated):  BMI Readings from Last 1 Encounters:  12/15/22 39.18 kg/m   Is BMI greater than 35 kg/m2?:  Yes Age older than 48 years old?: No Has large neck size > 40 cm (15.7 in, large male shirt size, large male collar size > 16): Yes Gender Male?:  Yes STOP-Bang Total Score(total yes answers):  7   Does Not Have Health Insurance   Significant barrier for Social determinants of health: no insurance so we limited to just blood pressure blood pressure management today to 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   Benign Essential Hypertension   Long history of hypertension Took  lisinopril in past He couldn't tolerate hydrochloroTHIAZIDE before         Depression Screen    12/15/2022    8:25 AM 09/01/2018    4:34 PM 01/10/2016   11:10 AM  PHQ 2/9 Scores  PHQ - 2 Score 0 0 0   No results found for any visits on 12/15/22.  The following were reviewed and entered/updated into his MEDICAL RECORD Teller History:  Diagnosis Date   Allergy    Hypertension    Sleep apnea 12/15/2022   History reviewed. No pertinent surgical  history. Family History  Problem Relation Age of Onset   Heart disease Mother    Hypertension Father    Outpatient Medications Prior to Visit  Medication Sig Dispense Refill   Multiple Vitamin (MULTIVITAMIN WITH MINERALS) TABS Take 1 tablet by mouth daily. Reported on 01/10/2016     varenicline (CHANTIX CONTINUING MONTH PAK) 1 MG tablet Take 1 tablet (1 mg total) by mouth 2 (two) times daily. 60 tablet 1   varenicline (CHANTIX STARTING MONTH PAK) 0.5 MG X 11 & 1 MG X 42 tablet Take one 0.5 mg tablet by mouth once daily for 3 days, then increase to one 0.5 mg tablet twice daily for 4 days, then increase to one 1 mg tablet twice daily. 53 tablet 0   ketorolac (TORADOL) 10 MG tablet Take by mouth. (Patient not taking: Reported on 12/15/2022)     lisinopril (PRINIVIL,ZESTRIL) 20 MG tablet Take 1 tablet (20 mg total) by mouth daily. 90 tablet 1   methocarbamol (ROBAXIN) 750 MG tablet TK 1 T PO QID FOR 10 DAYS  0   No facility-administered medications prior to visit.    Allergies  Allergen Reactions   Latex Rash   Social History   Tobacco Use   Smoking status: Every Day   Smokeless tobacco: Never  Vaping Use   Vaping Use: Never used  Substance Use Topics   Alcohol use: Yes    Alcohol/week: 12.0 standard drinks of alcohol    Types: 12 Cans of beer per week   Drug use: No    Immunization History  Administered Date(s) Administered   Influenza-Unspecified 09/18/2014    Objective:  BP (!) 145/84 (BP Location: Right Arm, Patient Position: Sitting)   Pulse 76   Temp 98 F (36.7 C) (Temporal)   Ht 5' 7.25" (1.708 m)   Wt 252 lb (114.3 kg)   SpO2 98%   BMI 39.18 kg/m  Body mass index is 39.18 kg/m. indicates this is an Obese male , but waist circumference is a better indicator of healthy body composition. Physical Exam  Vital signs reviewed.  Nursing notes reviewed. General Appearance/Constitutional:  polite male in no acute distress Musculoskeletal: All extremities are intact.   Neurological:  Awake, alert,  No obvious focal neurological deficits or cognitive impairments Psychiatric:  Appropriate mood, pleasant demeanor Problem-specific findings:  very thick neck.    Results Reviewed: Results for orders placed or performed in visit on 01/10/16  COMPLETE METABOLIC PANEL WITH GFR  Result Value Ref Range   Sodium 138 135 - 146 mmol/L   Potassium 5.0 3.5 - 5.3 mmol/L   Chloride 104 98 - 110 mmol/L   CO2 26 20 - 31 mmol/L   Glucose, Bld 106 (H) 65 - 99 mg/dL   BUN 14 7 - 25 mg/dL   Creat 1.07 0.60 - 1.35 mg/dL   Total Bilirubin 0.5 0.2 - 1.2 mg/dL   Alkaline Phosphatase 61 40 -  115 U/L   AST 31 10 - 40 U/L   ALT 39 9 - 46 U/L   Total Protein 7.1 6.1 - 8.1 g/dL   Albumin 4.5 3.6 - 5.1 g/dL   Calcium 9.2 8.6 - 10.3 mg/dL   GFR, Est African American >89 >=60 mL/min   GFR, Est Non African American 86 >=60 mL/min  POCT urinalysis dipstick  Result Value Ref Range   Color, UA yellow yellow   Clarity, UA clear clear   Glucose, UA negative negative   Bilirubin, UA negative negative   Ketones, POC UA negative negative   Spec Grav, UA 1.025    Blood, UA negative negative   pH, UA 5.5    Protein Ur, POC =30 (A) negative   Urobilinogen, UA 0.2    Nitrite, UA Negative Negative   Leukocytes, UA Negative Negative  POCT CBC  Result Value Ref Range   WBC 7.8 4.6 - 10.2 K/uL   Lymph, poc 2.8 0.6 - 3.4   POC LYMPH PERCENT 35.5 10 - 50 %L   MID (cbc) 0.7 0 - 0.9   POC MID % 8.8 0 - 12 %M   POC Granulocyte 4.3 2 - 6.9   Granulocyte percent 55.7 37 - 80 %G   RBC 5.48 4.69 - 6.13 M/uL   Hemoglobin 16.6 14.1 - 18.1 g/dL   HCT, POC 48.4 43.5 - 53.7 %   MCV 88.3 80 - 97 fL   MCH, POC 30.3 27 - 31.2 pg   MCHC 34.3 31.8 - 35.4 g/dL   RDW, POC 13.3 %   Platelet Count, POC 221 142 - 424 K/uL   MPV 6.8 0 - 99.8 fL

## 2022-12-15 NOTE — Addendum Note (Signed)
Addended by: Joanette Gula on: 12/15/2022 10:28 AM   Modules accepted: Orders

## 2023-03-24 ENCOUNTER — Other Ambulatory Visit: Payer: Self-pay | Admitting: Internal Medicine

## 2023-03-24 DIAGNOSIS — I10 Essential (primary) hypertension: Secondary | ICD-10-CM

## 2024-05-02 NOTE — Progress Notes (Signed)
 Subjective   Patient ID:  Marcus Cole is a 48 y.o. (DOB 1976/02/18) male    Patient presents with  . Rash    Pt presents today with c/o rash on his face and swelling on the left side     Rash   48 year old male presents with 2 days of itching and redness of the hands and face.  He has allergy to latex he will latex gloves and then wiped his face.  States he could not get his eyes open this morning due to the swelling.  No vision changes.  States his hands are really itchy.  Also a week ago he started having left lower dental pain and swelling.  He has recently had his wisdom teeth taken out couple months ago.  He was put on amoxicillin which helped and the symptoms returned.  Blood pressure is elevated this visit, he does have a history of hypertension.  Reports he has not taken his blood pressure medication today.  Denies chest pain, shortness of breath.  Also reports to smoking and he drinks energy drinks.   Reviewed and updated this visit by provider: Tobacco  Allergies  Meds  Problems  Med Hx  Surg Hx  Fam Hx         Objective   Vitals:   05/02/24 1652 05/02/24 1707  BP: (!) 153/100 (!) 115/115  Pulse: 83   Temp: 97.6 F (36.4 C)   TempSrc: Tympanic   Resp: 16   Height: 5' 7 (1.702 m)   Weight: 257 lb (116.6 kg)   SpO2: 93% 95%  BMI (Calculated): 40.2      Physical Exam Constitutional:      General: He is not in acute distress.    Appearance: Normal appearance. He is not ill-appearing, toxic-appearing or diaphoretic.  HENT:     Head: Normocephalic and atraumatic.     Mouth/Throat:     Comments: Swelling and tenderness to the jaw around tooth 17.  Tenderness to palpation. Skin:    Findings: Rash present.     Comments: Pruritic erythematous macular papular rash to bilateral hands and face.  No pustules, no scaling, no vesicles, no crusting  Neurological:     Mental Status: He is alert.         No results found for this or any previous visit.   No  results found.  Assessment and Plan  1. Dental infection (Primary) -     amoxicillin-clavulanate (AUGMENTIN) 875-125 mg per tablet; Take one tablet by mouth 2 (two) times daily for 10 days. Take with food, Starting Tue 05/02/2024, Until Fri 05/12/2024, Normal -     lidocaine (XYLOCAINE) 2% solution; Take 5 mLs by mouth 3 (three) times a day as needed (sore throat). Swish and spit, Starting Tue 05/02/2024, Normal 2. Allergic reaction, initial encounter -     predniSONE (DELTASONE) 10 mg tablet; Take 2 pills today.  Then 4 pills daily x 3 days, then 3 pills daily x 3 days, then 2 pills daily x 3 days, then 1 pill daily x 3 days., Normal 3. Irritant dermatitis -     predniSONE (DELTASONE) 10 mg tablet; Take 2 pills today.  Then 4 pills daily x 3 days, then 3 pills daily x 3 days, then 2 pills daily x 3 days, then 1 pill daily x 3 days., Normal 4. Elevated blood pressure reading -     AMB REFERRAL TO PCP 5. Encounter to establish care with new doctor -  AMB REFERRAL TO PCP    Take your blood pressure medication as soon as you get home and prior to starting prednisone taper.  OTC Tylenol  and ibuprofen as needed for pain.  May also swish and spit with lidocaine 5 mL 3 times daily as needed for dental pain.  For dental infection will treat with Augmentin 875 mg 2 times a day for 10 days.  Take this medication with food.  Any new onset or worsening of symptoms such as increasing pain not being able to tolerate food or fluid, chest pain, shortness of breath go to the ER or follow-up with your PCP. Follow up for PCP, ED if symptoms worsen.    Risks, benefits, and alternatives of the medications and treatment plan prescribed today were discussed, and patient expressed understanding. Plan follow-up as discussed or as needed if any worsening symptoms or change in condition.

## 2024-10-02 ENCOUNTER — Emergency Department (HOSPITAL_BASED_OUTPATIENT_CLINIC_OR_DEPARTMENT_OTHER): Payer: Self-pay | Admitting: Radiology

## 2024-10-02 ENCOUNTER — Emergency Department (HOSPITAL_BASED_OUTPATIENT_CLINIC_OR_DEPARTMENT_OTHER)
Admission: EM | Admit: 2024-10-02 | Discharge: 2024-10-03 | Disposition: A | Discharge status: Home / Self Care | Payer: Self-pay | Attending: Emergency Medicine | Admitting: Emergency Medicine

## 2024-10-02 ENCOUNTER — Emergency Department (HOSPITAL_BASED_OUTPATIENT_CLINIC_OR_DEPARTMENT_OTHER): Payer: Self-pay

## 2024-10-02 DIAGNOSIS — R600 Localized edema: Secondary | ICD-10-CM | POA: Insufficient documentation

## 2024-10-02 DIAGNOSIS — Z91040 Latex allergy status: Secondary | ICD-10-CM | POA: Insufficient documentation

## 2024-10-02 DIAGNOSIS — M545 Low back pain, unspecified: Secondary | ICD-10-CM | POA: Insufficient documentation

## 2024-10-02 DIAGNOSIS — Z79899 Other long term (current) drug therapy: Secondary | ICD-10-CM | POA: Insufficient documentation

## 2024-10-02 DIAGNOSIS — N289 Disorder of kidney and ureter, unspecified: Secondary | ICD-10-CM | POA: Insufficient documentation

## 2024-10-02 LAB — BASIC METABOLIC PANEL WITH GFR
Anion gap: 14 (ref 5–15)
BUN: 19 mg/dL (ref 6–20)
CO2: 21 mmol/L — ABNORMAL LOW (ref 22–32)
Calcium: 9.9 mg/dL (ref 8.9–10.3)
Chloride: 103 mmol/L (ref 98–111)
Creatinine, Ser: 1.42 mg/dL — ABNORMAL HIGH (ref 0.61–1.24)
GFR, Estimated: >60 mL/min (ref >60)
Glucose, Bld: 140 mg/dL — ABNORMAL HIGH (ref 70–99)
Potassium: 4 mmol/L (ref 3.5–5.1)
Sodium: 138 mmol/L (ref 135–145)

## 2024-10-02 LAB — CBC
HCT: 45.7 % (ref 39.0–52.0)
Hemoglobin: 16.3 g/dL (ref 13.0–17.0)
MCH: 31.5 pg (ref 26.0–34.0)
MCHC: 35.7 g/dL (ref 30.0–36.0)
MCV: 88.2 fL (ref 80.0–100.0)
Platelets: 202 K/uL (ref 150–400)
RBC: 5.18 MIL/uL (ref 4.22–5.81)
RDW: 13.2 % (ref 11.5–15.5)
WBC: 6.6 K/uL (ref 4.0–10.5)
nRBC: 0 % (ref 0.0–0.2)

## 2024-10-02 LAB — URINALYSIS, ROUTINE W REFLEX MICROSCOPIC
Bilirubin Urine: NEGATIVE
Glucose, UA: NEGATIVE mg/dL
Hgb urine dipstick: NEGATIVE
Leukocytes,Ua: NEGATIVE
Nitrite: NEGATIVE
Specific Gravity, Urine: 1.026 (ref 1.005–1.030)
pH: 6 (ref 5.0–8.0)

## 2024-10-02 LAB — PRO BRAIN NATRIURETIC PEPTIDE: Pro Brain Natriuretic Peptide: <50 pg/mL (ref <300.0)

## 2024-10-02 NOTE — ED Provider Triage Note (Signed)
 Emergency Medicine Provider Triage Evaluation Note  Marcus Cole , a 48 y.o. male  was evaluated in triage.  Pt complains of low back pain, frequent urination, bilateral leg swelling, SHOB  Review of Systems  Positive: Low back pain, frequent urination, leg swelling, SHOB Negative: CP, dizziness, HA, LOC, fever  Physical Exam  BP (!) 158/102 (BP Location: Right Arm)   Pulse 86   Temp 98 F (36.7 C) (Oral)   Resp 18   SpO2 97%  Gen:   Awake, no distress   Resp:  Normal effort  MSK:   Moves extremities without difficulty  Other:    Medical Decision Making  Medically screening exam initiated at 6:26 PM.  Appropriate orders placed.  Rosemary Pentecost was informed that the remainder of the evaluation will be completed by another provider, this initial triage assessment does not replace that evaluation, and the importance of remaining in the ED until their evaluation is complete.     Myriam Fonda RAMAN, [place] 10/02/24 [id number]

## 2024-10-02 NOTE — ED Triage Notes (Signed)
 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 Concerned for diabetes as well- told pre-diabetic at DOT physical 3 years ago.

## 2024-10-03 LAB — MAGNESIUM: Magnesium: 2.2 mg/dL (ref 1.7–2.4)

## 2024-10-03 LAB — TROPONIN T, HIGH SENSITIVITY
Troponin T High Sensitivity: <15 ng/L (ref 0–19)
Troponin T High Sensitivity: <15 ng/L (ref 0–19)

## 2024-10-03 LAB — HEPATIC FUNCTION PANEL
ALT: 43 U/L (ref 0–44)
AST: 29 U/L (ref 15–41)
Albumin: 4.3 g/dL (ref 3.5–5.0)
Alkaline Phosphatase: 80 U/L (ref 38–126)
Bilirubin, Direct: <0.1 mg/dL (ref 0.0–0.2)
Total Bilirubin: 0.2 mg/dL (ref 0.0–1.2)
Total Protein: 7.1 g/dL (ref 6.5–8.1)

## 2024-10-03 NOTE — ED Provider Notes (Signed)
 [facility] EMERGENCY DEPARTMENT AT [facility] Provider Note   CSN: [id number] Arrival date & time: 10/02/24  8261     Patient presents with: Multiple complaints   Marcus Cole is a 48 y.o. male.   presents with swelling in the neck and legs, accompanied by bruising, which he noticed over the past four to five days. The swelling is more pronounced in the left leg compared to the right and is located below the knees. The patient reports that the swelling comes and goes, with a history of similar episodes, particularly during the summer months. He also experiences shortness of breath but denies any chest pain. The patient consumes alcohol daily, averaging a six-pack of beer each night. He works as a Naval architect, which involves prolonged periods of sitting. The patient has a history of similar swelling episodes but notes that this episode is more severe. History was obtained from the patient and his wife.        Prior to Admission medications   Medication Sig Start Date End Date Taking? Authorizing Provider  lisinopril  (ZESTRIL ) 20 MG tablet TAKE 1 TABLET(20 MG) BY MOUTH DAILY 03/25/23   Jesus Bernardino MATSU, MD  Multiple Vitamins-Minerals (CENTRUM PO) Take 1 each by mouth daily. Gummy.    [provider]  varenicline  (CHANTIX  CONTINUING MONTH PAK) 1 MG tablet Take 1 tablet (1 mg total) by mouth 2 (two) times daily. 08/30/18   [place], Betty G, MD  varenicline  (CHANTIX  STARTING MONTH PAK) 0.5 MG X 11 & 1 MG X 42 tablet Take one 0.5 mg tablet by mouth once daily for 3 days, then increase to one 0.5 mg tablet twice daily for 4 days, then increase to one 1 mg tablet twice daily. 08/30/18   [place], Betty G, MD    Allergies: Latex    Review of Systems  Updated Vital Signs BP (!) 142/94   Pulse 65   Temp 98 F (36.7 C) (Oral)   Resp 19   SpO2 95%   Physical Exam Vitals and nursing note reviewed.  Constitutional:      Appearance: He is well-developed.  HENT:      Head: Normocephalic and atraumatic.  Cardiovascular:     Rate and Rhythm: Normal rate.  Pulmonary:     Effort: Pulmonary effort is normal. No respiratory distress.  Abdominal:     General: There is no distension.  Musculoskeletal:        General: Normal range of motion.     Cervical back: Normal range of motion.     Comments: Bilateral L>R edema with pitting, DP pulse intact  Neurological:     Mental Status: He is alert.     (all labs ordered are listed, but only abnormal results are displayed) Labs Reviewed  BASIC METABOLIC PANEL WITH GFR - Abnormal; Notable for the following components:      Result Value   CO2 21 (*)    Glucose, Bld 140 (*)    Creatinine, Ser 1.42 (*)    All other components within normal limits  URINALYSIS, ROUTINE W REFLEX MICROSCOPIC - Abnormal; Notable for the following components:   Ketones, ur TRACE (*)    Protein, ur TRACE (*)    All other components within normal limits  CBC  PRO BRAIN NATRIURETIC PEPTIDE  HEPATIC FUNCTION PANEL  MAGNESIUM  TROPONIN T, HIGH SENSITIVITY  TROPONIN T, HIGH SENSITIVITY    EKG: None  Radiology: US  Venous Img Lower Bilateral Result Date: 10/03/2024 CLINICAL DATA:  Lower  extremity edema for several days EXAM: BILATERAL LOWER EXTREMITY VENOUS DOPPLER ULTRASOUND TECHNIQUE: Gray-scale sonography with graded compression, as well as color Doppler and duplex ultrasound were performed to evaluate the lower extremity deep venous systems from the level of the common femoral vein and including the common femoral, femoral, profunda femoral, popliteal and calf veins including the posterior tibial, peroneal and gastrocnemius veins when visible. The superficial great saphenous vein was also interrogated. Spectral Doppler was utilized to evaluate flow at rest and with distal augmentation maneuvers in the common femoral, femoral and popliteal veins. COMPARISON:  None Available. FINDINGS: RIGHT LOWER EXTREMITY Common Femoral Vein: No  evidence of thrombus. Normal compressibility, respiratory phasicity and response to augmentation. Saphenofemoral Junction: No evidence of thrombus. Normal compressibility and flow on color Doppler imaging. Profunda Femoral Vein: No evidence of thrombus. Normal compressibility and flow on color Doppler imaging. Femoral Vein: No evidence of thrombus. Normal compressibility, respiratory phasicity and response to augmentation. Popliteal Vein: No evidence of thrombus. Normal compressibility, respiratory phasicity and response to augmentation. Calf Veins: No evidence of thrombus. Normal compressibility and flow on color Doppler imaging. Superficial Great Saphenous Vein: No evidence of thrombus. Normal compressibility and flow on color Doppler imaging. Venous Reflux:  None. Other Findings:  None. LEFT LOWER EXTREMITY Common Femoral Vein: No evidence of thrombus. Normal compressibility, respiratory phasicity and response to augmentation. Saphenofemoral Junction: No evidence of thrombus. Normal compressibility and flow on color Doppler imaging. Profunda Femoral Vein: No evidence of thrombus. Normal compressibility and flow on color Doppler imaging. Femoral Vein: No evidence of thrombus. Normal compressibility, respiratory phasicity and response to augmentation. Popliteal Vein: No evidence of thrombus. Normal compressibility, respiratory phasicity and response to augmentation. Calf Veins: No evidence of thrombus. Normal compressibility and flow on color Doppler imaging. Superficial Great Saphenous Vein: No evidence of thrombus. Normal compressibility and flow on color Doppler imaging. Venous Reflux:  None. Other Findings:  None. IMPRESSION: No evidence of deep venous thrombosis. Electronically Signed   By: Oneil Devonshire M.D.   On: 10/03/2024 00:41   DG Chest 2 View Result Date: 10/02/2024 CLINICAL DATA:  SHOB EXAM: CHEST - 2 VIEW COMPARISON:  09/07/2013 FINDINGS: No focal airspace consolidation, pleural effusion, or  pneumothorax. No cardiomegaly. Tortuous aorta with aortic atherosclerosis. No acute fracture or destructive lesions. Multilevel thoracic osteophytosis. IMPRESSION: No acute cardiopulmonary abnormality. Electronically Signed   By: Rogelia Myers M.D.   On: 10/02/2024 18:54     Procedures   Medications Ordered in the ED - No data to display                                  Medical Decision Making Amount and/or Complexity of Data Reviewed Labs: ordered. ECG/medicine tests: ordered.   48 year old male with bilateral lower extremity edema.  Unclear etiology.  He does have acute renal insufficiency so hesitate to start an empiric diuretics.  No evidence of heart failure.  DVT studies were negative.  Does have back pain so could be a primary renal issue and will need to follow-up with primary doctor for further evaluation and management.  Advised decreasing his alcohol intake preferably to none.  Final diagnoses:  Leg edema  Bilateral low back pain without sciatica, unspecified chronicity  Acute renal insufficiency    ED Discharge Orders     None          Nou Chard, Selinda, MD 10/03/24 [id number]
